# Patient Record
Sex: Female | Born: 1960 | Race: White | Hispanic: No | Marital: Married | Smoking: Never smoker
Health system: Southern US, Community
[De-identification: ages and names within clinical notes are randomized; demographics above are authoritative.]

## PROBLEM LIST (undated history)

## (undated) DIAGNOSIS — I4891 Unspecified atrial fibrillation: Secondary | ICD-10-CM

## (undated) DIAGNOSIS — K2970 Gastritis, unspecified, without bleeding: Secondary | ICD-10-CM

## (undated) DIAGNOSIS — G4730 Sleep apnea, unspecified: Secondary | ICD-10-CM

## (undated) DIAGNOSIS — M722 Plantar fascial fibromatosis: Secondary | ICD-10-CM

## (undated) DIAGNOSIS — K449 Diaphragmatic hernia without obstruction or gangrene: Secondary | ICD-10-CM

## (undated) DIAGNOSIS — I493 Ventricular premature depolarization: Secondary | ICD-10-CM

## (undated) HISTORY — DX: Plantar fascial fibromatosis: M72.2

## (undated) HISTORY — DX: Sleep apnea, unspecified: G47.30

## (undated) HISTORY — DX: Ventricular premature depolarization: I49.3

## (undated) HISTORY — DX: Unspecified atrial fibrillation: I48.91

## (undated) HISTORY — DX: Diaphragmatic hernia without obstruction or gangrene: K44.9

## (undated) HISTORY — PX: NASAL SEPTUM SURGERY: SHX37

## (undated) HISTORY — DX: Gastritis, unspecified, without bleeding: K29.70

## (undated) HISTORY — PX: FLOUROSCOPY ONLY (ARMC HX): HXRAD1708

---

## 2005-01-11 LAB — CONVERTED CEMR LAB

## 2006-11-26 ENCOUNTER — Encounter: Admission: RE | Admit: 2006-11-26 | Discharge: 2006-11-26 | Payer: Self-pay | Admitting: Family Medicine

## 2006-12-03 ENCOUNTER — Encounter: Payer: Self-pay | Admitting: Family Medicine

## 2006-12-30 ENCOUNTER — Encounter: Admission: RE | Admit: 2006-12-30 | Discharge: 2006-12-30 | Payer: Self-pay | Admitting: Family Medicine

## 2007-06-25 ENCOUNTER — Encounter: Admission: RE | Admit: 2007-06-25 | Discharge: 2007-06-25 | Payer: Self-pay | Admitting: Family Medicine

## 2007-07-07 ENCOUNTER — Ambulatory Visit: Payer: Self-pay | Admitting: Family Medicine

## 2007-07-07 DIAGNOSIS — L209 Atopic dermatitis, unspecified: Secondary | ICD-10-CM | POA: Insufficient documentation

## 2007-07-07 DIAGNOSIS — Q809 Congenital ichthyosis, unspecified: Secondary | ICD-10-CM

## 2007-07-15 ENCOUNTER — Telehealth: Payer: Self-pay | Admitting: Family Medicine

## 2007-08-30 ENCOUNTER — Ambulatory Visit: Payer: Self-pay | Admitting: Family Medicine

## 2007-09-02 ENCOUNTER — Telehealth: Payer: Self-pay | Admitting: Family Medicine

## 2008-02-09 ENCOUNTER — Ambulatory Visit: Payer: Self-pay | Admitting: Family Medicine

## 2008-02-09 ENCOUNTER — Encounter: Admission: RE | Admit: 2008-02-09 | Discharge: 2008-02-09 | Payer: Self-pay | Admitting: Family Medicine

## 2008-02-09 DIAGNOSIS — M25519 Pain in unspecified shoulder: Secondary | ICD-10-CM

## 2008-02-10 ENCOUNTER — Encounter: Payer: Self-pay | Admitting: Family Medicine

## 2008-08-24 ENCOUNTER — Ambulatory Visit: Payer: Self-pay | Admitting: Family Medicine

## 2008-11-15 ENCOUNTER — Encounter: Payer: Self-pay | Admitting: Family Medicine

## 2008-12-22 ENCOUNTER — Ambulatory Visit: Payer: Self-pay | Admitting: Family Medicine

## 2008-12-26 ENCOUNTER — Encounter: Payer: Self-pay | Admitting: Family Medicine

## 2008-12-29 ENCOUNTER — Ambulatory Visit: Payer: Self-pay | Admitting: Family Medicine

## 2009-02-02 ENCOUNTER — Ambulatory Visit: Payer: Self-pay | Admitting: Family Medicine

## 2009-06-15 IMAGING — CR DG SHOULDER 2+V*R*
3 series · 3 of 3 positions shown · non-contrast
Comparison: None

CLINICAL DATA: Right shoulder pain

RIGHT SHOULDER - 2+ VIEW

[view not recorded (1 of 3)]
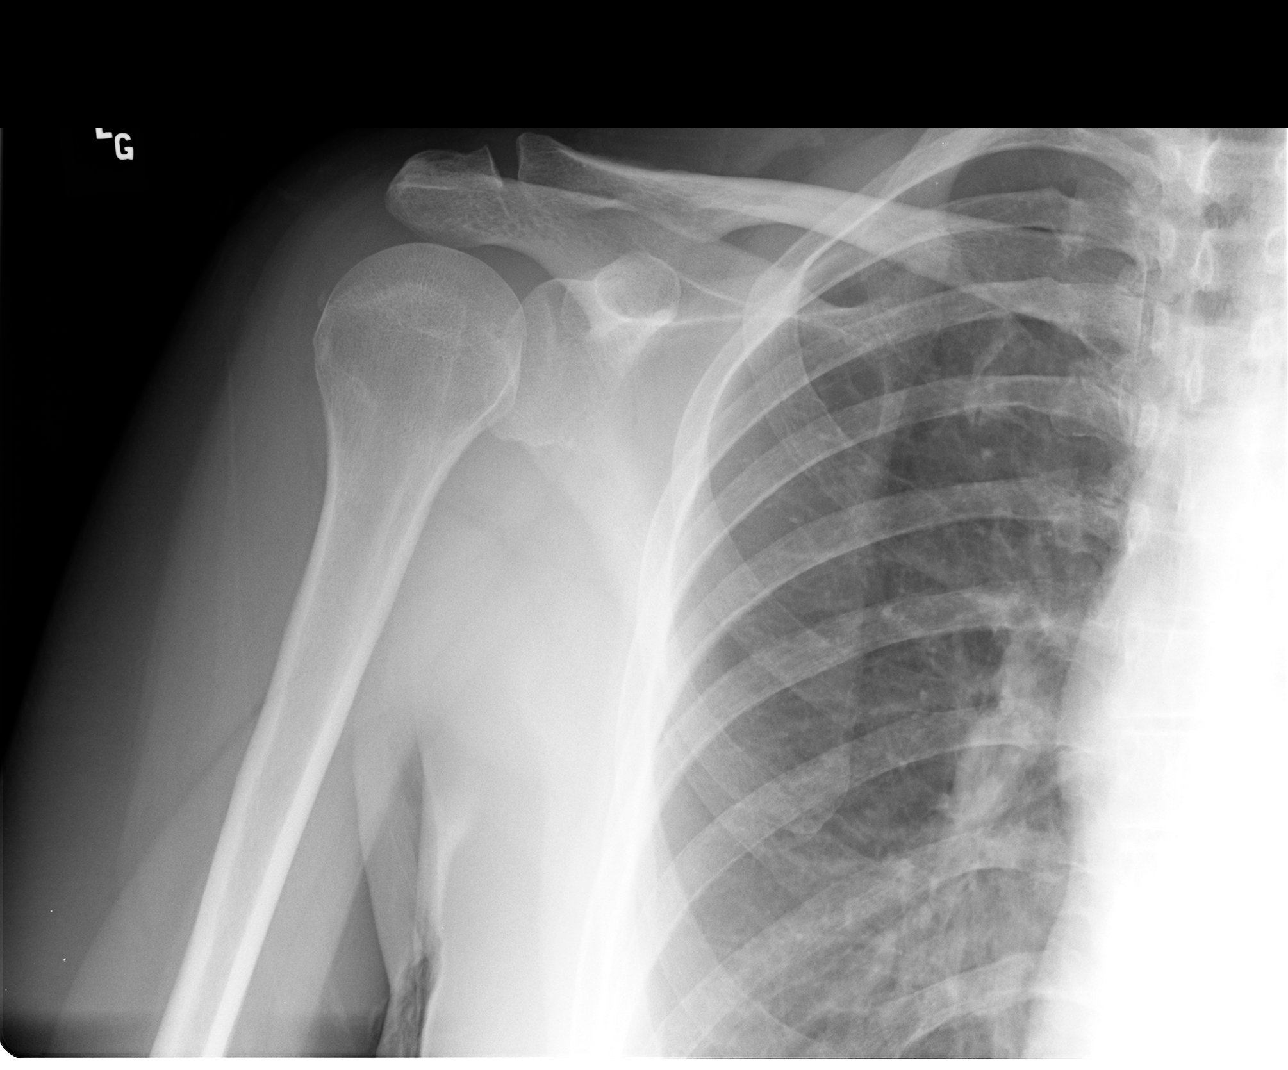

[view not recorded (2 of 3)]
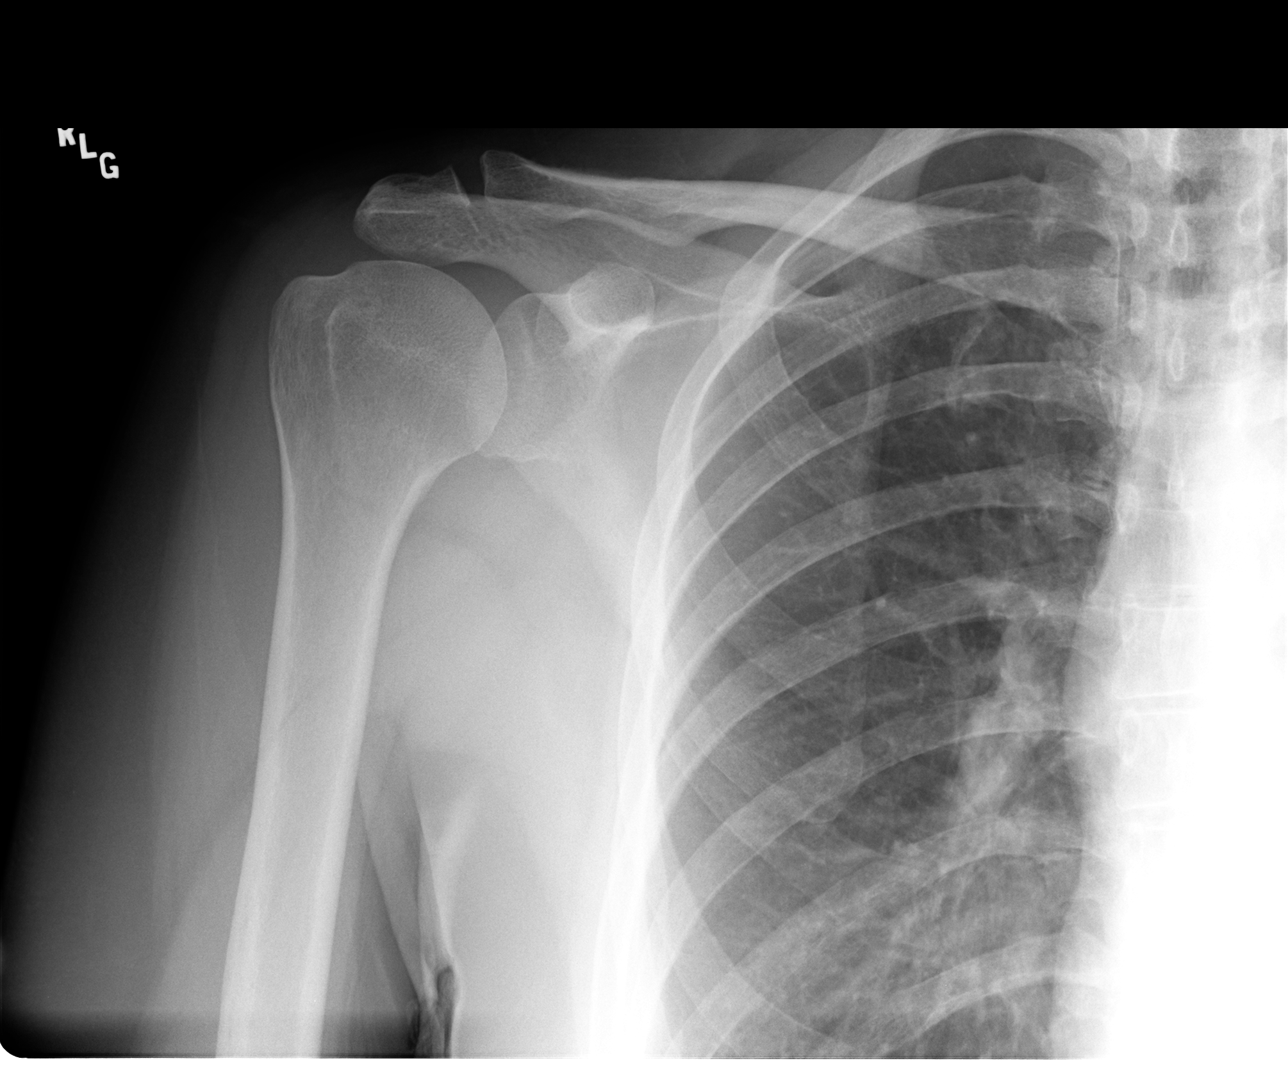

[view not recorded (3 of 3)]
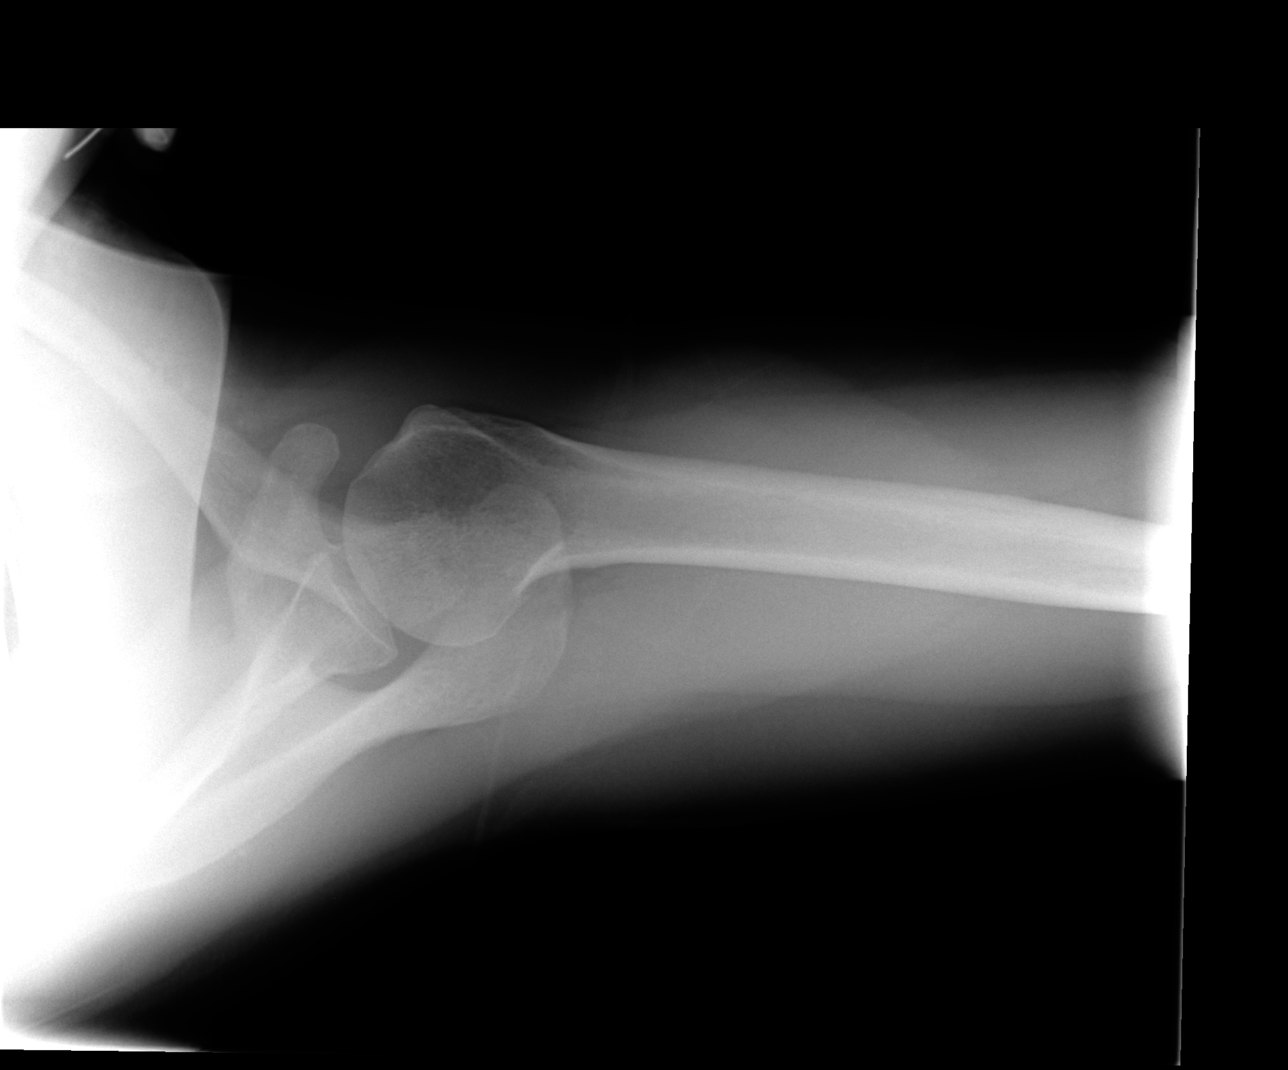

[3 of 3 positions shown; findings below may reference images not displayed]

FINDINGS: There is no evidence for fracture.  No subluxation or
dislocation.  The acromioclavicular and coracoclavicular distances
are preserved.  No evidence for os acromiale.

A tiny focus of calcification is seen along the posterior aspect of
the rotator cuff insertion may be related to previous trauma.
Calcific tendonitis could also have this appearance.
IMPRESSION: No acute bony abnormality.

Question degenerative change versus ossific tendonitis in the
rotator cuff, near the insertion.

## 2009-09-10 ENCOUNTER — Ambulatory Visit: Payer: Self-pay | Admitting: Family Medicine

## 2009-09-13 ENCOUNTER — Telehealth: Payer: Self-pay | Admitting: Family Medicine

## 2009-09-20 ENCOUNTER — Ambulatory Visit: Payer: Self-pay | Admitting: Family Medicine

## 2009-09-20 ENCOUNTER — Encounter: Admission: RE | Admit: 2009-09-20 | Discharge: 2009-09-20 | Payer: Self-pay | Admitting: Family Medicine

## 2009-09-20 DIAGNOSIS — R05 Cough: Secondary | ICD-10-CM

## 2009-10-03 ENCOUNTER — Telehealth: Payer: Self-pay | Admitting: Family Medicine

## 2010-02-20 ENCOUNTER — Telehealth: Payer: Self-pay | Admitting: Family Medicine

## 2010-11-12 NOTE — Progress Notes (Signed)
  Phone Note Call from Patient Call back at [phone]   Caller: Spouse Summary of Call: Call pt: We didn't see pt on 10/7-05/2009 dates. Call pt at [phone] adn let her know.  Initial call taken by: Nani Gasser MD,  Feb 20, 2010 12:04 PM  Follow-up for Phone Call        I called pt. at that number and [facility] informing her of Dr. Shelah Lewandowsky findings.Michaelle Copas  Feb 20, 2010 3:00 PM  Follow-up by: Michaelle Copas,  Feb 20, 2010 3:00 PM

## 2011-01-25 IMAGING — CR DG CHEST 2V
2 series · 2 of 2 positions shown · non-contrast
Comparison: None

CLINICAL DATA: Cough, history of right lung surgery many years ago

CHEST - 2 VIEW

[view not recorded (1 of 2)]
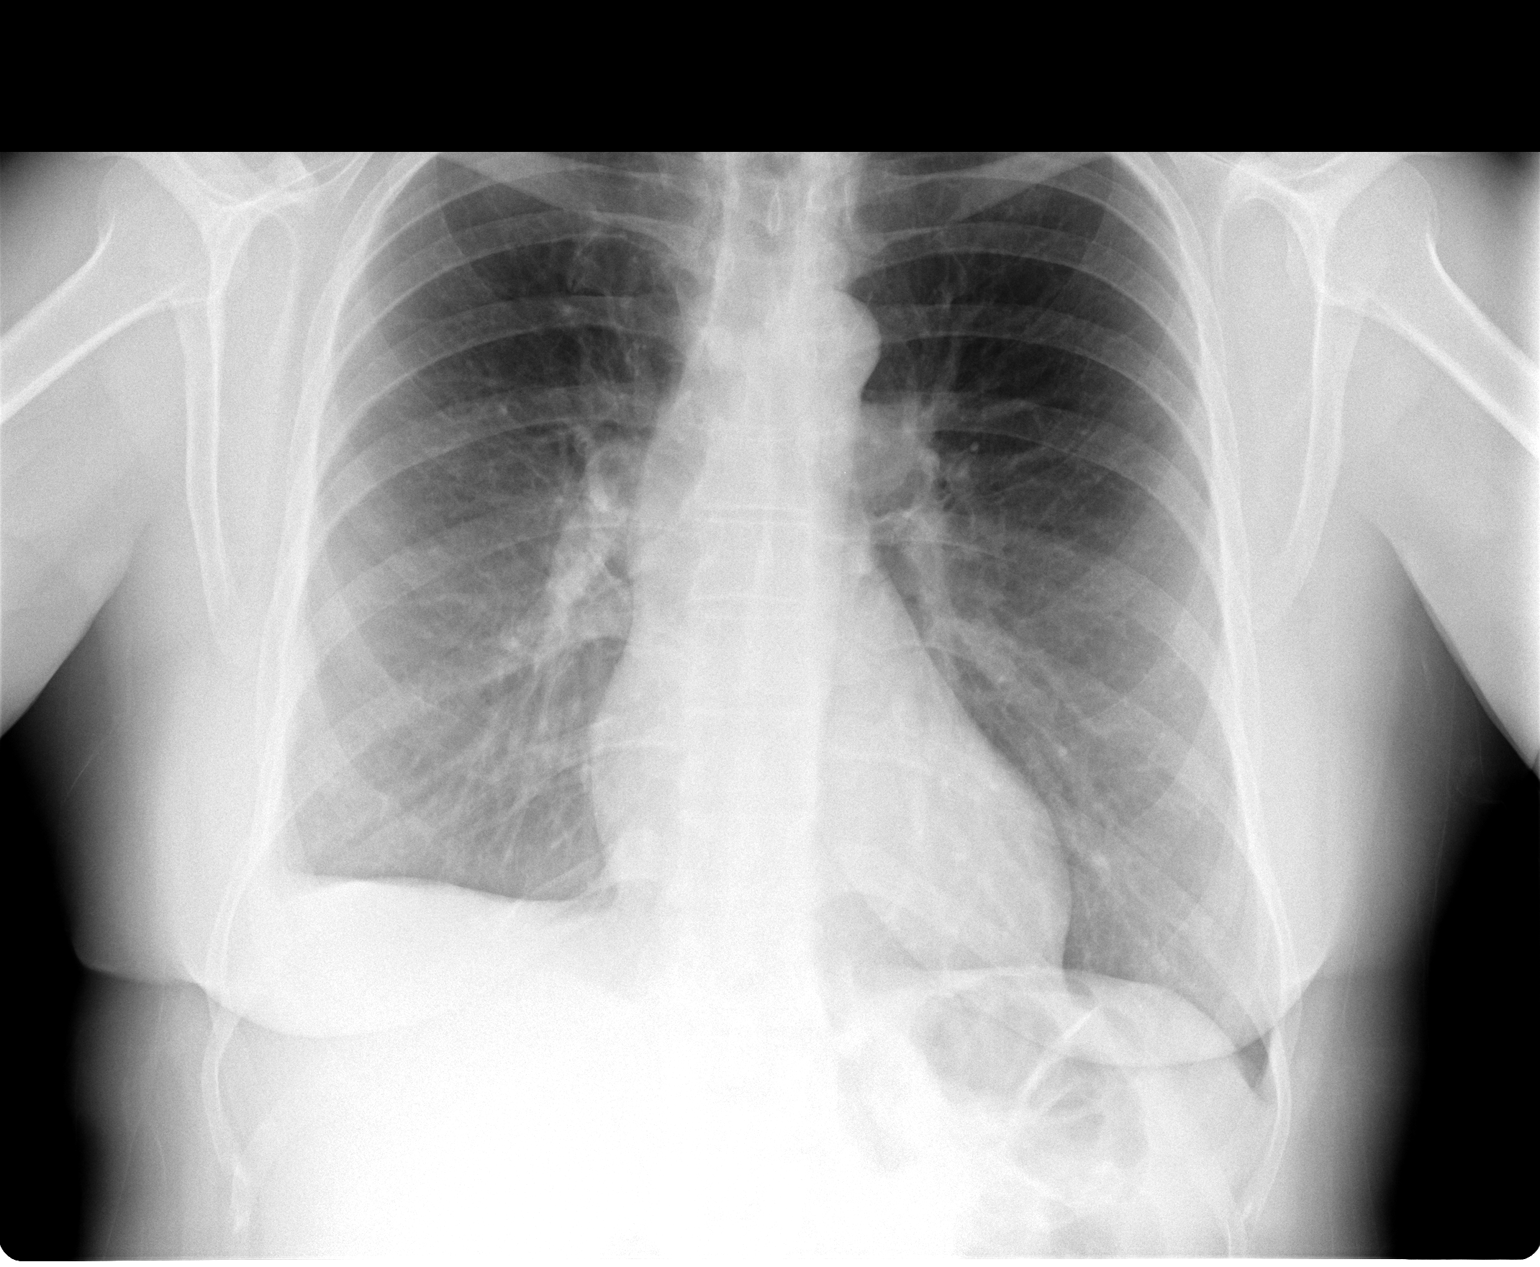

[view not recorded (2 of 2)]
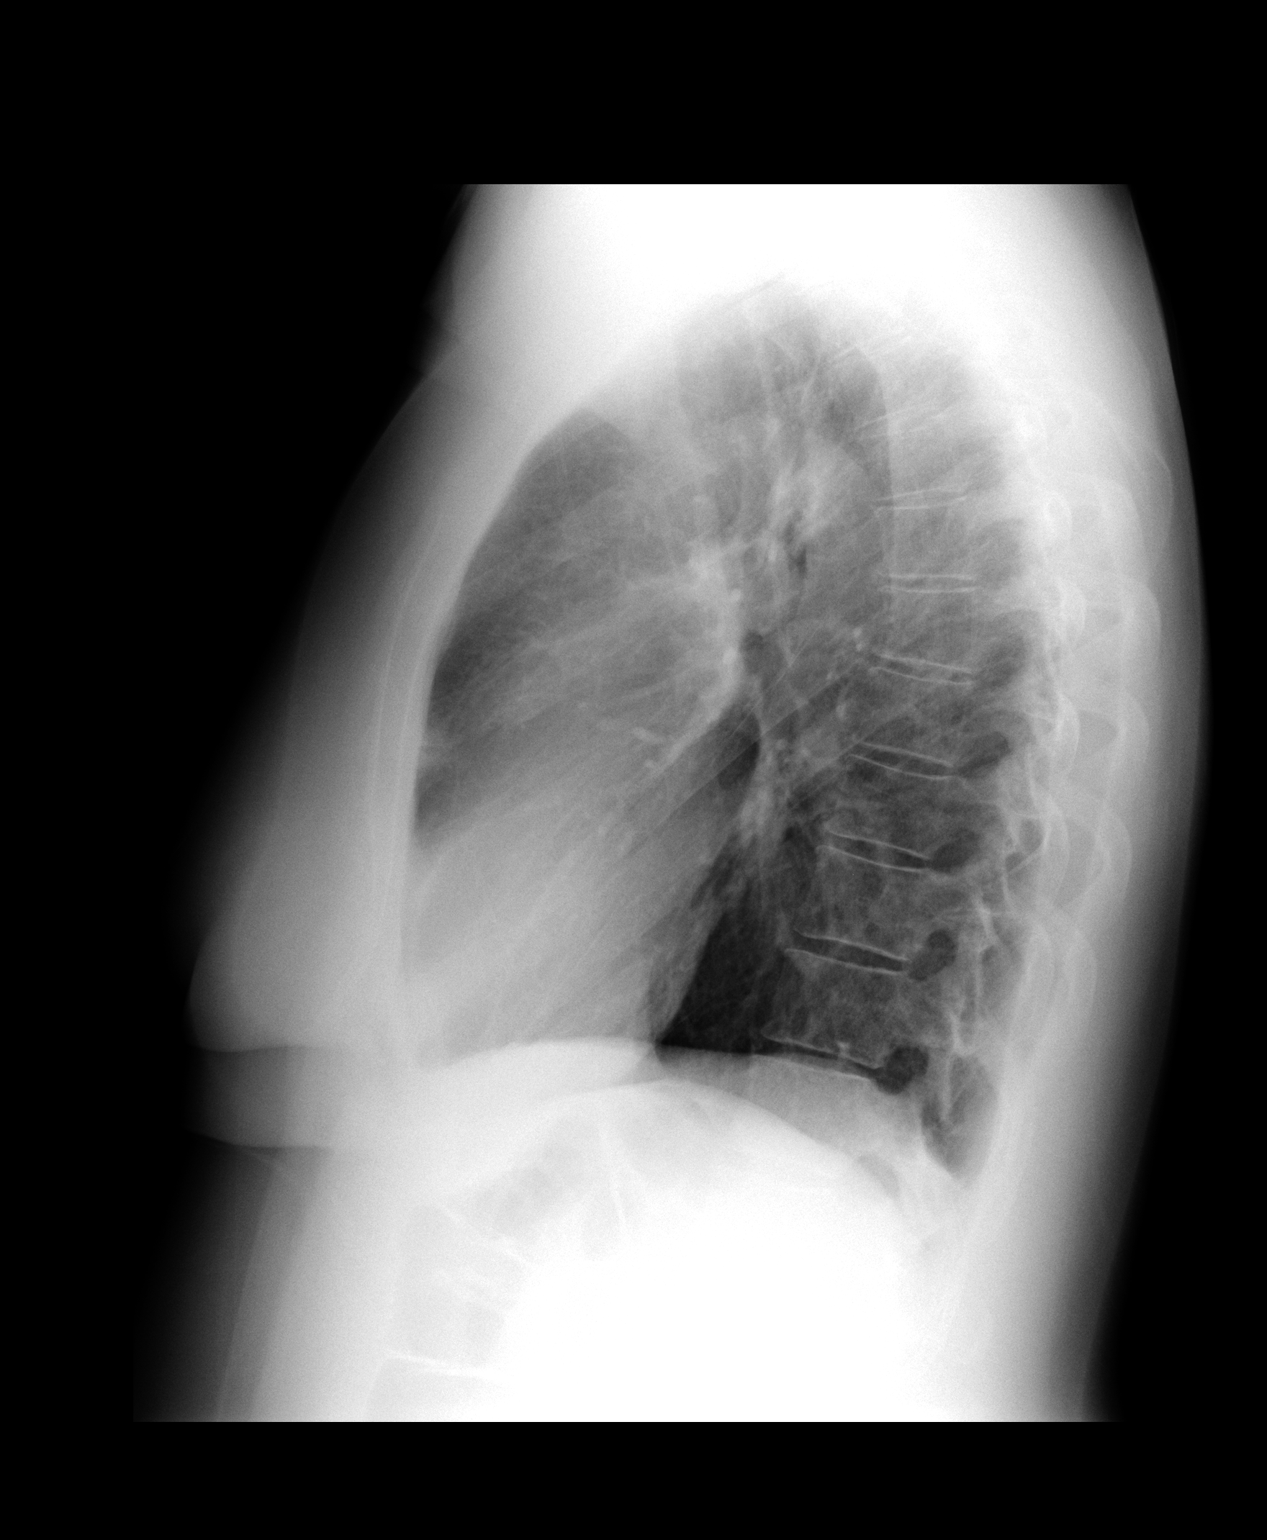

[2 of 2 positions shown; findings below may reference images not displayed]

FINDINGS: The lungs are clear.  Blunting of the right costophrenic
angle is most consistent with scarring.  The heart is within normal
limits in size.  No bony abnormality is seen.
IMPRESSION: No active lung disease.

## 2014-04-07 LAB — HM COLONOSCOPY

## 2014-12-11 LAB — BASIC METABOLIC PANEL
BUN: 13 mg/dL (ref 4–21)
CREATININE: 0.7 mg/dL (ref 0.5–1.1)
Glucose: 93 mg/dL
POTASSIUM: 4.5 mmol/L (ref 3.4–5.3)
Sodium: 139 mmol/L (ref 137–147)

## 2014-12-11 LAB — CBC AND DIFFERENTIAL
HCT: 36 % (ref 36–46)
Hemoglobin: 11.4 g/dL — AB (ref 12.0–16.0)
PLATELETS: 246 10*3/uL (ref 150–399)
WBC: 7.6 10*3/mL

## 2014-12-11 LAB — LIPID PANEL
CHOLESTEROL: 206 mg/dL — AB (ref 0–200)
HDL: 49 mg/dL (ref 35–70)
LDL Cholesterol: 122 mg/dL
Triglycerides: 173 mg/dL — AB (ref 40–160)

## 2014-12-11 LAB — TSH: TSH: 1.17 u[IU]/mL (ref 0.41–5.90)

## 2014-12-11 LAB — HEPATIC FUNCTION PANEL
ALT: 14 U/L (ref 7–35)
AST: 12 U/L — AB (ref 13–35)

## 2015-02-27 ENCOUNTER — Telehealth: Payer: Self-pay

## 2015-02-27 NOTE — Telephone Encounter (Signed)
Patient is looking for a doctor for her daughter. 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 Do have any recommendations?

## 2015-02-28 NOTE — Telephone Encounter (Signed)
Left detailed message.   

## 2015-02-28 NOTE — Telephone Encounter (Signed)
This sounds very familiar to fibromyalgia.  She would need to see a rheumatologist.  Can try  Dr. Ancil LinseyBravo or Dr. Virgel ManifoldSemble

## 2015-11-05 LAB — HM MAMMOGRAPHY

## 2016-08-12 ENCOUNTER — Ambulatory Visit (INDEPENDENT_AMBULATORY_CARE_PROVIDER_SITE_OTHER): Payer: BC Managed Care – PPO | Admitting: Family Medicine

## 2016-08-12 ENCOUNTER — Encounter: Payer: Self-pay | Admitting: Family Medicine

## 2016-08-12 ENCOUNTER — Other Ambulatory Visit: Payer: Self-pay | Admitting: *Deleted

## 2016-08-12 DIAGNOSIS — M5442 Lumbago with sciatica, left side: Secondary | ICD-10-CM

## 2016-08-12 DIAGNOSIS — Z8679 Personal history of other diseases of the circulatory system: Secondary | ICD-10-CM | POA: Diagnosis not present

## 2016-08-12 DIAGNOSIS — G8929 Other chronic pain: Secondary | ICD-10-CM

## 2016-08-12 DIAGNOSIS — Z8719 Personal history of other diseases of the digestive system: Secondary | ICD-10-CM

## 2016-08-12 DIAGNOSIS — F410 Panic disorder [episodic paroxysmal anxiety] without agoraphobia: Secondary | ICD-10-CM | POA: Diagnosis not present

## 2016-08-12 DIAGNOSIS — R002 Palpitations: Secondary | ICD-10-CM

## 2016-08-12 DIAGNOSIS — M5441 Lumbago with sciatica, right side: Secondary | ICD-10-CM

## 2016-08-12 MED ORDER — METOPROLOL TARTRATE 25 MG PO TABS: 25.0000 mg | ORAL_TABLET | Freq: Two times a day (BID) | ORAL | 1 refills | Status: DC

## 2016-08-13 DIAGNOSIS — Z8679 Personal history of other diseases of the circulatory system: Secondary | ICD-10-CM | POA: Insufficient documentation

## 2016-08-13 DIAGNOSIS — M545 Low back pain, unspecified: Secondary | ICD-10-CM | POA: Insufficient documentation

## 2016-08-13 DIAGNOSIS — R002 Palpitations: Secondary | ICD-10-CM | POA: Insufficient documentation

## 2016-08-13 DIAGNOSIS — F410 Panic disorder [episodic paroxysmal anxiety] without agoraphobia: Secondary | ICD-10-CM | POA: Insufficient documentation

## 2016-08-13 DIAGNOSIS — Z8719 Personal history of other diseases of the digestive system: Secondary | ICD-10-CM | POA: Insufficient documentation

## 2016-08-13 DIAGNOSIS — G8929 Other chronic pain: Secondary | ICD-10-CM | POA: Insufficient documentation

## 2016-08-13 NOTE — Progress Notes (Signed)
Subjective:    Patient ID: Ana HanksSusan D Scarpati, female    DOB: 08/12/1961, 55 y.o.   MRN: [id number]  HPI She comes in today to establish care. She was previously seen in our clinic about 10 years ago. More recently she's been going to walk [facility].  She was producing diagnosed with atrial fibrillation. Fortunately it actually resolved on its own without ablation. She follows with cardiology and sees Dr. Gaye PollackSinclair. She still continues to have intermittent palpitations but these are well controlled on metoprolol. Next  She does have underlying anxiety and occasionally expresses anxiety attacks but they are infrequent.  She also has chronic low back pain. She has had pain on and off for years but over the last year or so her pain has continued to persist. In fact about 6 months ago she went to the urgent care at [place] and they did an x-ray. She said it showed some arthritis but otherwise was fairly normal. She does have some radiation of pain down into both hips. Worse on the left side compared to the right. In the last year is also been traveling up her back which is unusual. It seems to be worse with prolonged standing. To ice it which does provide some temporary relief and she uses the heating pads that she buy over-the-counter and can wear. She occasionally will take an NSAID and that helps as well but really tries to not take them regularly because she does have a prior history of gastritis. She did go to one session of physical therapy and got the exercises to do on her own at home but admits she has not been consistent with those.    Review of Systems  Constitutional: Negative for appetite change, chills, diaphoresis and unexpected weight change.  HENT: Negative for dental problem, hearing loss, rhinorrhea and voice change.   Eyes: Negative for visual disturbance.  Respiratory: Negative for cough and wheezing.   Cardiovascular: Negative for palpitations.   Gastrointestinal: Negative for blood in stool, diarrhea, nausea and vomiting.  Genitourinary: Negative for dysuria, enuresis and vaginal discharge.  Musculoskeletal: Negative for arthralgias and myalgias.  Skin: Negative for rash.  Neurological: Negative for weakness and headaches.  Hematological: Negative for adenopathy. Does not bruise/bleed easily.  Psychiatric/Behavioral: Negative for behavioral problems, dysphoric mood and sleep disturbance. The patient is not nervous/anxious.        Objective:   Physical Exam  Constitutional: She is oriented to person, place, and time. She appears well-developed and well-nourished.  HENT:  Head: Normocephalic and atraumatic.  Right Ear: External ear normal.  Left Ear: External ear normal.  Nose: Nose normal.  Eyes: Conjunctivae and EOM are normal.  Neck: Neck supple. No thyromegaly present.  Cardiovascular: Normal rate, regular rhythm and normal heart sounds.   No murmur heard. No carotid bruit  Pulmonary/Chest: Effort normal and breath sounds normal.  Lymphadenopathy:    She has no cervical adenopathy.  Neurological: She is alert and oriented to person, place, and time.  Skin: Skin is warm and dry.  Psychiatric: She has a normal mood and affect. Her behavior is normal. Judgment and thought content normal.       Assessment & Plan:  Palpitations-heart exam is normal today. We'll go ahead and refill her metoprolol. Most a she just takes it once a day and occasionally will take a second dose later in the day more so for anxiety reasons. Next  Anxiety-typically just takes when necessary extra metoprolol to  help control her symptoms. A lot of her anxiety is focused around driving.  Chronic low back pain-encouraged her to consider more formal physical therapy and to really work on the stretches that she was given. I think yoga would be very helpful since she also has tight hamstrings. We discussed that chronic back pain requires a lot of home  maintenance and therapy and working on course. The next that would be to consider further evaluation to see if she might benefit from injections. She's not recommended for surgery at this point in time.

## 2016-09-01 ENCOUNTER — Ambulatory Visit (INDEPENDENT_AMBULATORY_CARE_PROVIDER_SITE_OTHER): Payer: BC Managed Care – PPO | Admitting: Family Medicine

## 2016-09-01 ENCOUNTER — Encounter: Payer: Self-pay | Admitting: Family Medicine

## 2016-09-01 VITALS — BP 116/72 | HR 77 | Temp 97.9°F | Wt 208.0 lb

## 2016-09-01 DIAGNOSIS — J0181 Other acute recurrent sinusitis: Secondary | ICD-10-CM

## 2016-09-01 MED ORDER — AMOXICILLIN 500 MG PO CAPS: 1000.0000 mg | ORAL_CAPSULE | Freq: Two times a day (BID) | ORAL | 0 refills | Status: AC

## 2016-09-01 NOTE — Progress Notes (Signed)
Ana Valdez is a 55 y.o. female who [name] today for sinus congestion.  She has had rhinorrhea, nasal congestion, frontal sinus headache, and cough for 1 month, but these symptoms have gotten worse in the past few days. She has also had thick, dark yellow nasal discharge for the past week. No fever/chills, nausea, vomiting or diarrhea. She has been taking sudafed, ibuprofen, and albuterol in the past few days which have helped somewhat.   She has a history of recurrent sinus infections and had sinus surgery 3 years ago. Since then, she gets about 2 sinus infections a year that feel just like this. Amoxicillin and Augmentin have both worked in the past. Patient denies any allergic reaction to amoxicillin.  No past medical history on file. No past surgical history on file. Social History  Substance Use Topics  . Smoking status: Never Smoker  . Smokeless tobacco: Not on file  . Alcohol use Not on file   family history is not on file.  ROS as above:  Medications: Current Outpatient Prescriptions  Medication Sig Dispense Refill  . cetirizine (ZYRTEC) 10 MG tablet Take by mouth.    Marland Kitchen. GARLIC PO Take 1 tablet by mouth daily.    Marland Kitchen. GLUCOSAMINE-CHONDROITIN PO Take 1 capsule by mouth daily.    Marland Kitchen. ibuprofen (ADVIL,MOTRIN) 200 MG tablet Take 200 mg by mouth as needed.    . metoprolol tartrate (LOPRESSOR) 25 MG tablet Take 1 tablet (25 mg total) by mouth 2 (two) times daily. 180 tablet 1  . Multiple Vitamin (THERA) TABS Take 1 tablet by mouth daily.    Marland Kitchen. NAPROXEN PO Take by mouth as needed.    . Omega-3 Fatty Acids (FISH OIL) 1000 MG CAPS Take 1 capsule by mouth daily.    . Progesterone Micronized (PROGESTERONE PO) Take by mouth.    . pseudoephedrine (SUDAFED) 30 MG tablet Take 30 mg by mouth as needed for congestion.    . TURMERIC CURCUMIN PO Take 1  capsule by mouth daily.     No current facility-administered medications for this visit.    Allergies  Allergen Reactions  . Cephalexin Hives and Itching    Health Maintenance Health Maintenance  Topic Date Due  . Hepatitis C Screening  28-Jan-1961  . HIV Screening  02/18/1977  . TETANUS/TDAP  02/18/1981  . PAP SMEAR  01/12/2008  . MAMMOGRAM  02/19/2012  . COLONOSCOPY  02/19/2012  . INFLUENZA VACCINE  05/13/2016     Exam:  BP 116/72   Pulse 77   Temp 97.9 F (36.6 C) (Oral)   Wt 208 lb (94.3 kg)   SpO2 96%   BMI 34.97 kg/m  Gen: Well NAD HEENT: Frontal and maxillary sinuses mildly tender to palpation bilaterally, conjunctiva clear, EOMI, tympanic membranes gray and translucent bilaterally, nasal turbinates erythematous, MMM, throat non-erythematous, no cervical lymphadenopathy Lungs: Normal work of breathing. CTABL Heart: RRR no MRG Abd: NABS, Soft. Nondistended, Nontender Exts: Brisk capillary refill, warm and well perfused.    No results found for this or any previous visit (from the past 72 hour(s)). No results found.    Assessment and Plan: 55 y.o. female with history of sinus surgery and recurrent sinus infections who presents with 1 month of nasal congestion, thick yellow nasal discharge, and sinus tenderness on exam. Most likely recurrent sinus infection. Treat with a 5-7 day course of amoxicillin, and call office if not improved in 1  week.    No orders of the defined types were placed in this encounter.   Discussed warning signs or symptoms. Please see discharge instructions. Patient expresses understanding.  I interviewed and examined the patient Independently.  Clementeen GrahamEvan Corey, MD

## 2016-09-01 NOTE — Patient Instructions (Addendum)
Thank you for coming in today. We will treat this as another sinus infection. Take amoxicillin 500 mg twice a day for 5-7 days. Call the office if not better in 1 week.   Sinusitis, Adult Sinusitis is soreness and inflammation of your sinuses. Sinuses are hollow spaces in the bones around your face. Your sinuses are located:  Around your eyes.  In the middle of your forehead.  Behind your nose.  In your cheekbones. Your sinuses and nasal passages are lined with a stringy fluid (mucus). Mucus normally drains out of your sinuses. When your nasal tissues become inflamed or swollen, the mucus can become trapped or blocked so air cannot flow through your sinuses. This allows bacteria, viruses, and funguses to grow, which leads to infection. Sinusitis can develop quickly and last for 7?10 days (acute) or for more than 12 weeks (chronic). Sinusitis often develops after a cold. What are the causes? This condition is caused by anything that creates swelling in the sinuses or stops mucus from draining, including:  Allergies.  Asthma.  Bacterial or viral infection.  Abnormally shaped bones between the nasal passages.  Nasal growths that contain mucus (nasal polyps).  Narrow sinus openings.  Pollutants, such as chemicals or irritants in the air.  A foreign object stuck in the nose.  A fungal infection. This is rare. What increases the risk? The following factors may make you more likely to develop this condition:  Having allergies or asthma.  Having had a recent cold or respiratory tract infection.  Having structural deformities or blockages in your nose or sinuses.  Having a weak immune system.  Doing a lot of swimming or diving.  Overusing nasal sprays.  Smoking. What are the signs or symptoms? The main symptoms of this condition are pain and a feeling of pressure around the affected sinuses. Other symptoms include:  Upper toothache.  Earache.  Headache.  Bad  breath.  Decreased sense of smell and taste.  A cough that may get worse at night.  Fatigue.  Fever.  Thick drainage from your nose. The drainage is often green and it may contain pus (purulent).  Stuffy nose or congestion.  Postnasal drip. This is when extra mucus collects in the throat or back of the nose.  Swelling and warmth over the affected sinuses.  Sore throat.  Sensitivity to light. How is this diagnosed? This condition is diagnosed based on symptoms, a medical history, and a physical exam. To find out if your condition is acute or chronic, your health care provider may:  Look in your nose for signs of nasal polyps.  Tap over the affected sinus to check for signs of infection.  View the inside of your sinuses using an imaging device that has a light attached (endoscope). If your health care provider suspects that you have chronic sinusitis, you may also:  Be tested for allergies.  Have a sample of mucus taken from your nose (nasal culture) and checked for bacteria.  Have a mucus sample examined to see if your sinusitis is related to an allergy. If your sinusitis does not respond to treatment and it lasts longer than 8 weeks, you may have an MRI or CT scan to check your sinuses. These scans also help to determine how severe your infection is. In rare cases, a bone biopsy may be done to rule out more serious types of fungal sinus disease. How is this treated? Treatment for sinusitis depends on the cause and whether your condition is chronic  or acute. If a virus is causing your sinusitis, your symptoms will go away on their own within 10 days. You may be given medicines to relieve your symptoms, including:  Topical nasal decongestants. They shrink swollen nasal passages and let mucus drain from your sinuses.  Antihistamines. These drugs block inflammation that is triggered by allergies. This can help to ease swelling in your nose and sinuses.  Topical nasal  corticosteroids. These are nasal sprays that ease inflammation and swelling in your nose and sinuses.  Nasal saline washes. These rinses can help to get rid of thick mucus in your nose. If your condition is caused by bacteria, you will be given an antibiotic medicine. If your condition is caused by a fungus, you will be given an antifungal medicine. Surgery may be needed to correct underlying conditions, such as narrow nasal passages. Surgery may also be needed to remove polyps. Follow these instructions at home: Medicines  Take, use, or apply over-the-counter and prescription medicines only as told by your health care provider. These may include nasal sprays.  If you were prescribed an antibiotic medicine, take it as told by your health care provider. Do not stop taking the antibiotic even if you start to feel better. Hydrate and Humidify  Drink enough water to keep your urine clear or pale yellow. Staying hydrated will help to thin your mucus.  Use a cool mist humidifier to keep the humidity level in your home above 50%.  Inhale steam for 10-15 minutes, 3-4 times a day or as told by your health care provider. You can do this in the bathroom while a hot shower is running.  Limit your exposure to cool or dry air. Rest  Rest as much as possible.  Sleep with your head raised (elevated).  Make sure to get enough sleep each night. General instructions  Apply a warm, moist washcloth to your face 3-4 times a day or as told by your health care provider. This will help with discomfort.  Wash your hands often with soap and water to reduce your exposure to viruses and other germs. If soap and water are not available, use hand sanitizer.  Do not smoke. Avoid being around people who are smoking (secondhand smoke).  Keep all follow-up visits as told by your health care provider. This is important. Contact a health care provider if:  You have a fever.  Your symptoms get worse.  Your  symptoms do not improve within 10 days. Get help right away if:  You have a severe headache.  You have persistent vomiting.  You have pain or swelling around your face or eyes.  You have vision problems.  You develop confusion.  Your neck is stiff.  You have trouble breathing. This information is not intended to replace advice given to you by your health care provider. Make sure you discuss any questions you have with your health care provider. Document Released: 09/29/2005 Document Revised: 05/25/2016 Document Reviewed: 07/25/2015 Elsevier Interactive Patient Education  2017 [employer]

## 2016-10-03 ENCOUNTER — Encounter: Payer: Self-pay | Admitting: Family Medicine

## 2016-11-05 ENCOUNTER — Other Ambulatory Visit: Payer: Self-pay | Admitting: *Deleted

## 2016-11-05 MED ORDER — METOPROLOL TARTRATE 25 MG PO TABS: 25.0000 mg | ORAL_TABLET | Freq: Two times a day (BID) | ORAL | 0 refills | Status: AC

## 2016-12-03 ENCOUNTER — Other Ambulatory Visit: Payer: Self-pay | Admitting: Family Medicine

## 2016-12-03 DIAGNOSIS — L209 Atopic dermatitis, unspecified: Secondary | ICD-10-CM

## 2016-12-03 NOTE — Telephone Encounter (Signed)
Pt called to request an Rx for clobetasol propionate cream. States she uses this PRN for her eczema. It has never been written by PCP. Pending and routing for review.

## 2016-12-04 MED ORDER — CLOBETASOL PROPIONATE 0.05 % EX CREA: 1.0000 "application " | TOPICAL_CREAM | Freq: Every day | CUTANEOUS | 0 refills | Status: DC | PRN

## 2016-12-11 ENCOUNTER — Encounter: Payer: BC Managed Care – PPO | Admitting: Family Medicine

## 2017-01-20 ENCOUNTER — Ambulatory Visit (INDEPENDENT_AMBULATORY_CARE_PROVIDER_SITE_OTHER): Payer: BC Managed Care – PPO | Admitting: Family Medicine

## 2017-01-20 ENCOUNTER — Other Ambulatory Visit (HOSPITAL_COMMUNITY)
Admission: RE | Admit: 2017-01-20 | Discharge: 2017-01-20 | Disposition: A | Discharge status: Home / Self Care | Payer: BC Managed Care – PPO | Source: Ambulatory Visit | Attending: Family Medicine | Admitting: Family Medicine

## 2017-01-20 VITALS — BP 127/58 | HR 67 | Ht 64.0 in | Wt 211.0 lb

## 2017-01-20 DIAGNOSIS — R87610 Atypical squamous cells of undetermined significance on cytologic smear of cervix (ASC-US): Secondary | ICD-10-CM

## 2017-01-20 DIAGNOSIS — Z114 Encounter for screening for human immunodeficiency virus [HIV]: Secondary | ICD-10-CM

## 2017-01-20 DIAGNOSIS — Z1151 Encounter for screening for human papillomavirus (HPV): Secondary | ICD-10-CM

## 2017-01-20 DIAGNOSIS — Z0000 Encounter for general adult medical examination without abnormal findings: Secondary | ICD-10-CM

## 2017-01-20 DIAGNOSIS — Z1159 Encounter for screening for other viral diseases: Secondary | ICD-10-CM

## 2017-01-20 LAB — COMPLETE METABOLIC PANEL WITH GFR
ALBUMIN: 4.2 g/dL (ref 3.6–5.1)
ALT: 17 U/L (ref 6–29)
AST: 16 U/L (ref 10–35)
Alkaline Phosphatase: 53 U/L (ref 33–130)
BUN: 17 mg/dL (ref 7–25)
CHLORIDE: 106 mmol/L (ref 98–110)
CO2: 27 mmol/L (ref 20–31)
Calcium: 9.4 mg/dL (ref 8.6–10.4)
Creat: 0.82 mg/dL (ref 0.50–1.05)
GFR, EST NON AFRICAN AMERICAN: 81 mL/min (ref >=60)
GFR, Est African American: >89 mL/min (ref >=60)
Glucose, Bld: 96 mg/dL (ref 65–99)
POTASSIUM: 4.4 mmol/L (ref 3.5–5.3)
SODIUM: 142 mmol/L (ref 135–146)
Total Bilirubin: 0.4 mg/dL (ref 0.2–1.2)
Total Protein: 7 g/dL (ref 6.1–8.1)

## 2017-01-20 LAB — LIPID PANEL W/REFLEX DIRECT LDL
Cholesterol: 215 mg/dL — ABNORMAL HIGH (ref <200)
HDL: 47 mg/dL — AB (ref >50)
LDL-CHOLESTEROL: 132 mg/dL — AB
NON-HDL CHOLESTEROL (CALC): 168 mg/dL — AB (ref <130)
TRIGLYCERIDES: 221 mg/dL — AB (ref <150)
Total CHOL/HDL Ratio: 4.6 Ratio (ref <5.0)

## 2017-01-20 NOTE — Progress Notes (Signed)
Subjective:     Ana Valdez is a 56 y.o. female and is here for a comprehensive physical exam. The patient reports no problems.  Social History   Social History  . Marital status: Married    Spouse name: N/A  . Number of children: N/A  . Years of education: N/A   Occupational History  . Not on file.   Social History Main Topics  . Smoking status: Never Smoker  . Smokeless tobacco: Not on file  . Alcohol use Not on file  . Drug use: Unknown  . Sexual activity: Not on file   Other Topics Concern  . Not on file   Social History Narrative  . No narrative on file   Health Maintenance  Topic Date Due  . Hepatitis C Screening  Jul 08, 1961  . HIV Screening  02/18/1977  . PAP SMEAR  01/12/2008  . MAMMOGRAM  02/19/2012  . COLONOSCOPY  02/19/2012  . INFLUENZA VACCINE  05/13/2017  . TETANUS/TDAP  11/22/2019    The following portions of the patient's history were reviewed and updated as appropriate: allergies, current medications, past family history, past medical history, past social history, past surgical history and problem list.  Review of Systems A comprehensive review of systems was negative.   Objective:    BP (!) 127/58   Pulse 67   Ht  (1.626 m)   Wt 211 lb (95.7 kg)   BMI 36.22 kg/m  General appearance: alert, cooperative and appears stated age Head: Normocephalic, without obvious abnormality, atraumatic Eyes: conj clear, EOMI, PEERLA Ears: normal TM's and external ear canals both ears Nose: Nares normal. Septum midline. Mucosa normal. No drainage or sinus tenderness. Throat: lips, mucosa, and tongue normal; teeth and gums normal Neck: no adenopathy, no carotid bruit, no JVD, supple, symmetrical, trachea midline and thyroid not enlarged, symmetric, no tenderness/mass/nodules Back: symmetric, no curvature. ROM normal. No CVA tenderness. Lungs: clear to auscultation bilaterally Breasts: normal appearance, no masses or tenderness Heart: regular rate  and rhythm, S1, S2 normal, no murmur, click, rub or gallop Abdomen: soft, non-tender; bowel sounds normal; no masses,  no organomegaly Pelvic: cervix normal in appearance, external genitalia normal, no adnexal masses or tenderness, no cervical motion tenderness, rectovaginal septum normal, uterus normal size, shape, and consistency and vagina normal without discharge Extremities: extremities normal, atraumatic, no cyanosis or edema Pulses: 2+ and symmetric Skin: Skin color, texture, turgor normal. No rashes or lesions Lymph nodes: Cervical, supraclavicular, and axillary nodes normal. Neurologic: Alert and oriented X 3, normal strength and tone. Normal symmetric reflexes. Normal coordination and gait    Assessment:    Healthy female exam.      Plan:     See After Visit Summary for Counseling Recommendations   Keep up a regular exercise program and make sure you are eating a healthy diet Try to eat 4 servings of dairy a day, or if you are lactose intolerant take a calcium with vitamin D daily.  Your vaccines are up to date.

## 2017-01-20 NOTE — Patient Instructions (Signed)
Can follow up in one month if would like to discuss mood further.

## 2017-01-21 LAB — HIV ANTIBODY (ROUTINE TESTING W REFLEX): HIV 1&2 Ab, 4th Generation: NONREACTIVE

## 2017-01-21 LAB — HEPATITIS C ANTIBODY: HCV Ab: NEGATIVE

## 2017-01-23 LAB — CYTOLOGY - PAP
Diagnosis: UNDETERMINED — AB
HPV: NOT DETECTED

## 2017-01-27 NOTE — Addendum Note (Signed)
Addended by: Deno Etienne on: 01/27/2017 05:46 PM   Modules accepted: Orders

## 2017-02-02 ENCOUNTER — Encounter: Payer: Self-pay | Admitting: Family Medicine

## 2017-02-19 ENCOUNTER — Encounter: Payer: Self-pay | Admitting: Obstetrics & Gynecology

## 2017-02-19 ENCOUNTER — Ambulatory Visit: Payer: BC Managed Care – PPO | Admitting: Obstetrics & Gynecology

## 2017-02-19 VITALS — BP 110/68 | HR 79 | Resp 16 | Ht 65.0 in | Wt 212.0 lb

## 2017-02-19 DIAGNOSIS — Z0000 Encounter for general adult medical examination without abnormal findings: Secondary | ICD-10-CM

## 2017-02-19 NOTE — Progress Notes (Addendum)
   Subjective:    Patient ID: Ana HanksSusan D Callender, female    DOB: 05/14/1961, 56 y.o.   MRN: [id number]  HPI 56 yo MW P4 here for discussion of pap smear.   Review of Systems Mammogram negative 1/17 [facility]- no breast, gyn, colon cancer She had a colonscopy at 56 yo, told good for 10 years. Married for 20 years She recently [place] and is switching doctors to up there.    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 Specifically, she needs a pap in a year with HPV cotesting. Rec annual mammogram

## 2017-03-06 ENCOUNTER — Ambulatory Visit (INDEPENDENT_AMBULATORY_CARE_PROVIDER_SITE_OTHER): Payer: BC Managed Care – PPO

## 2017-03-06 DIAGNOSIS — Z0000 Encounter for general adult medical examination without abnormal findings: Secondary | ICD-10-CM

## 2017-03-06 DIAGNOSIS — R928 Other abnormal and inconclusive findings on diagnostic imaging of breast: Secondary | ICD-10-CM | POA: Diagnosis not present

## 2017-03-13 ENCOUNTER — Other Ambulatory Visit (HOSPITAL_COMMUNITY): Payer: Self-pay | Admitting: Obstetrics & Gynecology

## 2017-03-13 DIAGNOSIS — R928 Other abnormal and inconclusive findings on diagnostic imaging of breast: Secondary | ICD-10-CM

## 2017-03-18 ENCOUNTER — Ambulatory Visit
Admission: RE | Admit: 2017-03-18 | Discharge: 2017-03-18 | Disposition: A | Discharge status: Home / Self Care | Payer: BC Managed Care – PPO | Source: Ambulatory Visit | Attending: Obstetrics & Gynecology | Admitting: Obstetrics & Gynecology

## 2017-03-18 DIAGNOSIS — R928 Other abnormal and inconclusive findings on diagnostic imaging of breast: Secondary | ICD-10-CM

## 2017-03-25 ENCOUNTER — Telehealth: Payer: Self-pay | Admitting: Family Medicine

## 2017-03-25 NOTE — Telephone Encounter (Signed)
Okay, please call patient and see if maybe she went to [facility]. [place].

## 2017-03-25 NOTE — Telephone Encounter (Signed)
I sent over the Health Maintenance Form to get her colonoscopy results from [facility] and they sent back stating they do not have a patient with this name or DOB. Thanks

## 2017-04-07 ENCOUNTER — Other Ambulatory Visit: Payer: Self-pay | Admitting: Family Medicine

## 2017-04-07 MED ORDER — CLOBETASOL PROPIONATE 0.05 % EX CREA: 1.0000 "application " | TOPICAL_CREAM | Freq: Every day | CUTANEOUS | 0 refills | Status: DC | PRN

## 2017-04-08 ENCOUNTER — Encounter: Payer: Self-pay | Admitting: Family Medicine

## 2018-06-16 ENCOUNTER — Other Ambulatory Visit: Payer: Self-pay | Admitting: Family Medicine

## 2018-06-17 ENCOUNTER — Telehealth: Payer: Self-pay | Admitting: Family Medicine

## 2018-06-17 MED ORDER — CLOBETASOL PROPIONATE 0.05 % EX CREA: 1.0000 "application " | TOPICAL_CREAM | Freq: Every day | CUTANEOUS | 0 refills | Status: AC | PRN

## 2018-06-17 NOTE — Telephone Encounter (Signed)
Routing to pcp for review.Romelle Reiley Lynetta, CMA  

## 2018-06-17 NOTE — Telephone Encounter (Signed)
New rx sent

## 2018-06-17 NOTE — Addendum Note (Signed)
Addended by: Nani Gasser D on: 06/17/2018 08:00 PM   Modules accepted: Orders
